# Patient Record
Sex: Female | Born: 1995 | Race: Black or African American | Hispanic: No | Marital: Single | Smoking: Never smoker
Health system: Southern US, Community
[De-identification: ages and names within clinical notes are randomized; demographics above are authoritative.]

## PROBLEM LIST (undated history)

## (undated) DIAGNOSIS — G43909 Migraine, unspecified, not intractable, without status migrainosus: Secondary | ICD-10-CM

## (undated) HISTORY — PX: MOUTH SURGERY: SHX715

---
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
## 2017-05-14 ENCOUNTER — Other Ambulatory Visit: Payer: Self-pay | Admitting: Orthopedic Surgery

## 2017-05-14 DIAGNOSIS — R229 Localized swelling, mass and lump, unspecified: Secondary | ICD-10-CM

## 2017-05-24 ENCOUNTER — Ambulatory Visit
Admission: RE | Admit: 2017-05-24 | Discharge: 2017-05-24 | Disposition: A | Discharge status: Home / Self Care | Payer: 59 | Source: Ambulatory Visit | Attending: Orthopedic Surgery | Admitting: Orthopedic Surgery

## 2017-05-24 DIAGNOSIS — R229 Localized swelling, mass and lump, unspecified: Secondary | ICD-10-CM

## 2017-06-21 ENCOUNTER — Other Ambulatory Visit: Payer: Self-pay | Admitting: Orthopedic Surgery

## 2017-06-21 DIAGNOSIS — IMO0002 Reserved for concepts with insufficient information to code with codable children: Secondary | ICD-10-CM

## 2017-06-21 DIAGNOSIS — R229 Localized swelling, mass and lump, unspecified: Principal | ICD-10-CM

## 2017-07-13 ENCOUNTER — Ambulatory Visit
Admission: RE | Admit: 2017-07-13 | Discharge: 2017-07-13 | Disposition: A | Discharge status: Home / Self Care | Payer: 59 | Source: Ambulatory Visit | Attending: Orthopedic Surgery | Admitting: Orthopedic Surgery

## 2017-07-13 DIAGNOSIS — R229 Localized swelling, mass and lump, unspecified: Principal | ICD-10-CM

## 2017-07-13 DIAGNOSIS — IMO0002 Reserved for concepts with insufficient information to code with codable children: Secondary | ICD-10-CM

## 2017-07-13 MED ORDER — IOPAMIDOL (ISOVUE-M 200) INJECTION 41%
1.5000 mL | Freq: Once | INTRAMUSCULAR | Status: AC
  Administered 2017-07-13: 1.5 mL via INTRA_ARTICULAR

## 2020-02-22 ENCOUNTER — Emergency Department (HOSPITAL_BASED_OUTPATIENT_CLINIC_OR_DEPARTMENT_OTHER)
Admission: EM | Admit: 2020-02-22 | Discharge: 2020-02-22 | Disposition: A | Discharge status: Home / Self Care | Payer: 59 | Attending: Emergency Medicine | Admitting: Emergency Medicine

## 2020-02-22 ENCOUNTER — Other Ambulatory Visit: Payer: Self-pay

## 2020-02-22 ENCOUNTER — Encounter (HOSPITAL_BASED_OUTPATIENT_CLINIC_OR_DEPARTMENT_OTHER): Payer: Self-pay | Admitting: Emergency Medicine

## 2020-02-22 ENCOUNTER — Ambulatory Visit (HOSPITAL_BASED_OUTPATIENT_CLINIC_OR_DEPARTMENT_OTHER): Payer: 59

## 2020-02-22 ENCOUNTER — Other Ambulatory Visit (HOSPITAL_BASED_OUTPATIENT_CLINIC_OR_DEPARTMENT_OTHER): Payer: Self-pay | Admitting: Family Medicine

## 2020-02-22 ENCOUNTER — Encounter (HOSPITAL_BASED_OUTPATIENT_CLINIC_OR_DEPARTMENT_OTHER): Payer: Self-pay

## 2020-02-22 DIAGNOSIS — R252 Cramp and spasm: Secondary | ICD-10-CM | POA: Insufficient documentation

## 2020-02-22 DIAGNOSIS — M79605 Pain in left leg: Secondary | ICD-10-CM

## 2020-02-22 MED ORDER — CYCLOBENZAPRINE HCL 10 MG PO TABS: 10.0000 mg | ORAL_TABLET | Freq: Two times a day (BID) | ORAL | 0 refills | Status: DC | PRN

## 2020-02-22 NOTE — Discharge Instructions (Addendum)
Recommend trial of NSAIDs such as naproxen or ibuprofen as well as the newly prescribed Flexeril for your pain. Note the flexeril can make you mildly drowsy and should not be taken while driving or operating heavy machinery. Call the radiology department to schedule your ultrasound.  Return to ER if you develop significantly worsening pain, swelling, or other new concerning symptom.

## 2020-02-22 NOTE — ED Triage Notes (Signed)
Cramping pain in left calf and shin x5 days.  Cramps come when she gets up from sitting position.  After "I get going", they stop.  It is only when she first gets up and starts walking.

## 2020-02-23 NOTE — ED Provider Notes (Signed)
[facility] EMERGENCY DEPARTMENT Provider Note   CSN: [id number] Arrival date & time: 02/22/20  1551     History Chief Complaint  Patient presents with  . Leg Pain    Sandra Shields is a 24 y.o. female.  Presents to ER with complaints of left leg cramping.  Symptoms ongoing for approximately 1 week, notes that gets worse with certain positions.  Primarily occurring in her left calf.  No pain or swelling on the right side.  No generalized swelling on left.  Is on oral contraceptives.  No other medical problems or medications.  No known history of DVT/PE.  HPI     History reviewed. No pertinent past medical history.  There are no problems to display for this patient.   Past Surgical History:  Procedure Laterality Date  . MOUTH SURGERY       OB History   No obstetric history on file.     No family history on file.  Social History   Tobacco Use  . Smoking status: Never Smoker  . Smokeless tobacco: Never Used  Substance Use Topics  . Alcohol use: No  . Drug use: No    Home Medications Prior to Admission medications   Medication Sig Start Date End Date Taking? Authorizing Provider  cyclobenzaprine (FLEXERIL) 10 MG tablet Take 1 tablet (10 mg total) by mouth 2 (two) times daily as needed for muscle spasms. 02/22/20   Lucrezia Starch, MD  norethindrone-ethinyl estradiol-iron (LARIN FE 1.5/30) 1.5-30 MG-MCG tablet Take 1 tablet by mouth daily.    [provider]    Allergies    Patient has no known allergies.  Review of Systems   Review of Systems  Constitutional: Negative for chills and fever.  HENT: Negative for ear pain and sore throat.   Eyes: Negative for pain and visual disturbance.  Respiratory: Negative for cough and shortness of breath.   Cardiovascular: Negative for chest pain and palpitations.  Gastrointestinal: Negative for abdominal pain and vomiting.  Genitourinary: Negative for dysuria and hematuria.  Musculoskeletal:  Positive for arthralgias. Negative for back pain.  Skin: Negative for color change and rash.  Neurological: Negative for seizures and syncope.  All other systems reviewed and are negative.   Physical Exam Updated Vital Signs BP 122/70 (BP Location: Right Arm)   Pulse 74   Temp 98.4 F (36.9 C)   Resp 14   Ht 5\' 6"  (1.676 m)   Wt 99.3 kg   LMP 02/13/2020   SpO2 100%   BMI 35.33 kg/m   Physical Exam Vitals and nursing note reviewed.  Constitutional:      General: She is not in acute distress.    Appearance: She is well-developed.  HENT:     Head: Normocephalic and atraumatic.  Eyes:     Conjunctiva/sclera: Conjunctivae normal.  Cardiovascular:     Rate and Rhythm: Normal rate and regular rhythm.     Heart sounds: No murmur.  Pulmonary:     Effort: Pulmonary effort is normal. No respiratory distress.     Breath sounds: Normal breath sounds.  Abdominal:     Palpations: Abdomen is soft.     Tenderness: There is no abdominal tenderness.  Musculoskeletal:     Cervical back: Neck supple.     Comments: Normal-appearing lower extremities bilaterally, no tenderness to palpation throughout lower extremities, compartments soft, distal DP and PT pulses intact bilaterally, normal joint ROM  Skin:    General: Skin is warm and dry.  Neurological:     General: No focal deficit present.     Mental Status: She is alert.     Comments: Sensation intact throughout lower extremities     ED Results / Procedures / Treatments   Labs (all labs ordered are listed, but only abnormal results are displayed) Labs Reviewed - No data to display  EKG None  Radiology No results found.  Procedures Procedures (including critical care time)  Medications Ordered in ED Medications - No data to display  ED Course  I have reviewed the triage vital signs and the nursing notes.  Pertinent labs & imaging results that were available during my care of the patient were reviewed by me and  considered in my medical decision making (see chart for details).    MDM Rules/Calculators/A&P                      24 year old girl presenting to ER with complaint of left leg cramping.  On physical exam no abnormalities were appreciated.  Recommended that patient obtain ultrasound to rule out DVT.  Patient reports that she was concerned about possible cost and wants to delay obtaining the study.  As alternative, ordered outpatient ultrasound.  Stressed importance of return for worsening symptoms and importance of obtaining this test. She demonstrated understanding of my concerns and desire to obtain test. Will give trial of muscle relaxer.     After the discussed management above, the patient was determined to be safe for discharge.  The patient was in agreement with this plan and all questions regarding their care were answered.  ED return precautions were discussed and the patient will return to the ED with any significant worsening of condition.   Final Clinical Impression(s) / ED Diagnoses Final diagnoses:  Leg cramping    Rx / DC Orders ED Discharge Orders         Ordered    US Venous Img Lower Unilateral Left     02/22/20 1637    cyclobenzaprine (FLEXERIL) 10 MG tablet  2 times daily PRN     02/22/20 1639           Milagros Loll, MD 02/23/20 2052

## 2020-11-23 DIAGNOSIS — N200 Calculus of kidney: Secondary | ICD-10-CM

## 2020-11-23 HISTORY — DX: Calculus of kidney: N20.0

## 2021-07-14 ENCOUNTER — Ambulatory Visit
Admission: EM | Admit: 2021-07-14 | Discharge: 2021-07-14 | Disposition: A | Discharge status: Home / Self Care | Payer: 59 | Attending: Emergency Medicine | Admitting: Emergency Medicine

## 2021-07-14 ENCOUNTER — Other Ambulatory Visit: Payer: Self-pay

## 2021-07-14 ENCOUNTER — Encounter: Payer: Self-pay | Admitting: Emergency Medicine

## 2021-07-14 DIAGNOSIS — R350 Frequency of micturition: Secondary | ICD-10-CM | POA: Insufficient documentation

## 2021-07-14 DIAGNOSIS — R3915 Urgency of urination: Secondary | ICD-10-CM | POA: Insufficient documentation

## 2021-07-14 DIAGNOSIS — R300 Dysuria: Secondary | ICD-10-CM | POA: Diagnosis not present

## 2021-07-14 LAB — POCT URINALYSIS DIP (MANUAL ENTRY)
Bilirubin, UA: NEGATIVE
Blood, UA: NEGATIVE
Glucose, UA: NEGATIVE mg/dL
Ketones, POC UA: NEGATIVE mg/dL
Leukocytes, UA: NEGATIVE
Nitrite, UA: NEGATIVE
Protein Ur, POC: NEGATIVE mg/dL
Spec Grav, UA: 1.025 (ref 1.010–1.025)
Urobilinogen, UA: 0.2 E.U./dL
pH, UA: 6 (ref 5.0–8.0)

## 2021-07-14 MED ORDER — CEPHALEXIN 500 MG PO CAPS: 1000.0000 mg | ORAL_CAPSULE | Freq: Two times a day (BID) | ORAL | 0 refills | Status: AC

## 2021-07-14 NOTE — Discharge Instructions (Addendum)
Your urinalysis did not show any signs of infection today, it has been sent to the lab to see if it grows bacteria  Due to your symptoms I am going to treat you prophylactically for a urinary tract infection, if your antibiotics need to be changed someone will call you  Take Keflex twice a day for the next 5 days  Your vaginal swab checking for bacterial vaginosis and yeast will take about 2 to 5 days to return, you will be called if positive for either in treatment will be sent into the pharmacy  At any point if your symptoms become worse please return to urgent care for reevaluation

## 2021-07-14 NOTE — ED Provider Notes (Signed)
[name]    CSN: [id number] Arrival date & time: 07/14/21  0905      History   Chief Complaint Chief Complaint  Patient presents with   Dysuria    HPI Sandra Shields is a 25 y.o. female.   Patient presents with dysuria, urinary urgency ,frequency, vaginal itching, lower abdominal pressure and left-sided flank For 5 days.  Unsure of discharge present.  Denies odor, hematuria, nausea, vomiting, diarrhea, fever, chills.  Using Tylenol with no relief.  History of BV.  Not sexually active.  History reviewed. No pertinent past medical history.  There are no problems to display for this patient.   Past Surgical History:  Procedure Laterality Date   MOUTH SURGERY      OB History   No obstetric history on file.      Home Medications    Prior to Admission medications   Medication Sig Start Date End Date Taking? Authorizing Provider  cephALEXin (KEFLEX) 500 MG capsule Take 2 capsules (1,000 mg total) by mouth 2 (two) times daily. 07/14/21  Yes Tashema Tiller R, NP  norethindrone-ethinyl estradiol-iron (LOESTRIN FE) 1.5-30 MG-MCG tablet Take 1 tablet by mouth daily.   Yes [provider]  cyclobenzaprine (FLEXERIL) 10 MG tablet Take 1 tablet (10 mg total) by mouth 2 (two) times daily as needed for muscle spasms. 02/22/20   Milagros Loll, MD    Family History No family history on file.  Social History Social History   Tobacco Use   Smoking status: Never   Smokeless tobacco: Never  Substance Use Topics   Alcohol use: No   Drug use: No     Allergies   Patient has no known allergies.   Review of Systems Review of Systems  Constitutional: Negative.   Respiratory: Negative.    Gastrointestinal:  Positive for abdominal pain. Negative for abdominal distention, anal bleeding, blood in stool, constipation, diarrhea, nausea, rectal pain and vomiting.  Genitourinary:  Positive for dysuria, flank pain, frequency, urgency and vaginal  discharge. Negative for decreased urine volume, difficulty urinating, dyspareunia, enuresis, genital sores, hematuria, menstrual problem, pelvic pain, vaginal bleeding and vaginal pain.  Skin: Negative.   Neurological: Negative.     Physical Exam Triage Vital Signs ED Triage Vitals  Enc Vitals Group     BP 07/14/21 0916 113/71     Pulse Rate 07/14/21 0916 72     Resp 07/14/21 0916 18     Temp 07/14/21 0916 98.6 F (37 C)     Temp Source 07/14/21 0916 Oral     SpO2 07/14/21 0916 98 %     Weight 07/14/21 0918 190 lb (86.2 kg)     Height 07/14/21 0918 5\' 6"  (1.676 m)     Head Circumference --      Peak Flow --      Pain Score 07/14/21 0918 6     Pain Loc --      Pain Edu? --      Excl. in GC? --    No data found.  Updated Vital Signs BP 113/71 (BP Location: Left Arm)   Pulse 72   Temp 98.6 F (37 C) (Oral)   Resp 18   Ht 5\' 6"  (1.676 m)   Wt 190 lb (86.2 kg)   LMP 06/30/2021   SpO2 98%   BMI 30.67 kg/m   Visual Acuity Right Eye Distance:   Left Eye Distance:   Bilateral Distance:    Right Eye Near:   Left  Eye Near:    Bilateral Near:     Physical Exam Constitutional:      Appearance: Normal appearance. She is normal weight.  Eyes:     Extraocular Movements: Extraocular movements intact.  Pulmonary:     Effort: Pulmonary effort is normal.  Abdominal:     General: Abdomen is flat. Bowel sounds are normal.     Palpations: Abdomen is soft.     Tenderness: There is abdominal tenderness in the suprapubic area. There is left CVA tenderness. There is no right CVA tenderness or guarding. Negative signs include Murphy's sign.  Musculoskeletal:     Cervical back: Normal range of motion.  Skin:    General: Skin is warm and dry.  Neurological:     Mental Status: She is alert and oriented to person, place, and time. Mental status is at baseline.  Psychiatric:        Mood and Affect: Mood normal.        Behavior: Behavior normal.     UC Treatments / Results   Labs (all labs ordered are listed, but only abnormal results are displayed) Labs Reviewed  URINE CULTURE  POCT URINALYSIS DIP (MANUAL ENTRY)  POCT URINALYSIS DIP (MANUAL ENTRY)  CERVICOVAGINAL ANCILLARY ONLY    EKG   Radiology No results found.  Procedures Procedures (including critical care time)  Medications Ordered in UC Medications - No data to display  Initial Impression / Assessment and Plan / UC Course  I have reviewed the triage vital signs and the nursing notes.  Pertinent labs & imaging results that were available during my care of the patient were reviewed by me and considered in my medical decision making (see chart for details).  Dysuria Urinary frequency Urinary urgency  1.  Urinalysis negative, sent for culture 2.  Will treat prophylactically for urinary tract infection due to symptomology, prescribed Keflex 1000 mg twice daily for 5 days 3.  STI screening for BV and yeast pending, will treat per protocol 4.  Given strict return precautions for persistent or worsening symptoms for reevaluation at urgent care Final Clinical Impressions(s) / UC Diagnoses   Final diagnoses:  Dysuria  Urinary frequency  Urinary urgency   Discharge Instructions   None    ED Prescriptions     Medication Sig Dispense Auth. Provider   cephALEXin (KEFLEX) 500 MG capsule Take 2 capsules (1,000 mg total) by mouth 2 (two) times daily. 20 capsule Valinda Hoar, NP      PDMP not reviewed this encounter.   Valinda Hoar, [place] 07/14/21 [phone]

## 2021-07-14 NOTE — ED Triage Notes (Signed)
Patient c/o dysuria, urgency and frequency for the past couple of days.  No hematuria.  Patient has taken Tylenol for the discomfort.

## 2021-07-15 LAB — URINE CULTURE: Culture: <10000 — AB

## 2021-07-15 LAB — CERVICOVAGINAL ANCILLARY ONLY
Bacterial Vaginitis (gardnerella): NEGATIVE
Candida Glabrata: NEGATIVE
Candida Vaginitis: NEGATIVE
Comment: NEGATIVE
Comment: NEGATIVE
Comment: NEGATIVE

## 2021-08-21 ENCOUNTER — Emergency Department (HOSPITAL_COMMUNITY)
Admission: EM | Admit: 2021-08-21 | Discharge: 2021-08-21 | Disposition: A | Discharge status: Home / Self Care | Payer: 59 | Attending: Emergency Medicine | Admitting: Emergency Medicine

## 2021-08-21 ENCOUNTER — Emergency Department (HOSPITAL_COMMUNITY): Payer: 59

## 2021-08-21 ENCOUNTER — Other Ambulatory Visit: Payer: Self-pay

## 2021-08-21 ENCOUNTER — Encounter (HOSPITAL_COMMUNITY): Payer: Self-pay

## 2021-08-21 DIAGNOSIS — R110 Nausea: Secondary | ICD-10-CM | POA: Diagnosis not present

## 2021-08-21 DIAGNOSIS — R1031 Right lower quadrant pain: Secondary | ICD-10-CM | POA: Diagnosis not present

## 2021-08-21 DIAGNOSIS — R1011 Right upper quadrant pain: Secondary | ICD-10-CM | POA: Diagnosis present

## 2021-08-21 DIAGNOSIS — N200 Calculus of kidney: Secondary | ICD-10-CM

## 2021-08-21 LAB — COMPREHENSIVE METABOLIC PANEL
ALT: 10 U/L (ref 0–44)
AST: 17 U/L (ref 15–41)
Albumin: 4 g/dL (ref 3.5–5.0)
Alkaline Phosphatase: 44 U/L (ref 38–126)
Anion gap: 9 (ref 5–15)
BUN: 15 mg/dL (ref 6–20)
CO2: 23 mmol/L (ref 22–32)
Calcium: 9.2 mg/dL (ref 8.9–10.3)
Chloride: 106 mmol/L (ref 98–111)
Creatinine, Ser: 1.07 mg/dL — ABNORMAL HIGH (ref 0.44–1.00)
GFR, Estimated: >60 mL/min (ref >60)
Glucose, Bld: 112 mg/dL — ABNORMAL HIGH (ref 70–99)
Potassium: 3.9 mmol/L (ref 3.5–5.1)
Sodium: 138 mmol/L (ref 135–145)
Total Bilirubin: 0.4 mg/dL (ref 0.3–1.2)
Total Protein: 8.3 g/dL — ABNORMAL HIGH (ref 6.5–8.1)

## 2021-08-21 LAB — URINALYSIS, ROUTINE W REFLEX MICROSCOPIC
Bilirubin Urine: NEGATIVE
Glucose, UA: NEGATIVE mg/dL
Hgb urine dipstick: NEGATIVE
Ketones, ur: NEGATIVE mg/dL
Leukocytes,Ua: NEGATIVE
Nitrite: NEGATIVE
Protein, ur: NEGATIVE mg/dL
Specific Gravity, Urine: 1.025 (ref 1.005–1.030)
pH: 6 (ref 5.0–8.0)

## 2021-08-21 LAB — CBC
HCT: 36.9 % (ref 36.0–46.0)
Hemoglobin: 12.2 g/dL (ref 12.0–15.0)
MCH: 29.4 pg (ref 26.0–34.0)
MCHC: 33.1 g/dL (ref 30.0–36.0)
MCV: 88.9 fL (ref 80.0–100.0)
Platelets: 377 10*3/uL (ref 150–400)
RBC: 4.15 MIL/uL (ref 3.87–5.11)
RDW: 12.6 % (ref 11.5–15.5)
WBC: 9.8 10*3/uL (ref 4.0–10.5)
nRBC: 0 % (ref 0.0–0.2)

## 2021-08-21 LAB — LIPASE, BLOOD: Lipase: 25 U/L (ref 11–51)

## 2021-08-21 LAB — I-STAT BETA HCG BLOOD, ED (MC, WL, AP ONLY): I-stat hCG, quantitative: <5 m[IU]/mL (ref <5)

## 2021-08-21 MED ORDER — HYDROMORPHONE HCL 1 MG/ML IJ SOLN
0.5000 mg | Freq: Once | INTRAMUSCULAR | Status: AC
  Administered 2021-08-21: 0.5 mg via INTRAVENOUS
  Filled 2021-08-21: qty 1

## 2021-08-21 MED ORDER — IOHEXOL 350 MG/ML SOLN
80.0000 mL | Freq: Once | INTRAVENOUS | Status: AC | PRN
  Administered 2021-08-21: 80 mL via INTRAVENOUS

## 2021-08-21 MED ORDER — MORPHINE SULFATE (PF) 4 MG/ML IV SOLN
6.0000 mg | Freq: Once | INTRAVENOUS | Status: AC
  Administered 2021-08-21: 6 mg via INTRAVENOUS
  Filled 2021-08-21: qty 2

## 2021-08-21 MED ORDER — KETOROLAC TROMETHAMINE 30 MG/ML IJ SOLN
30.0000 mg | Freq: Once | INTRAMUSCULAR | Status: AC
  Administered 2021-08-21: 30 mg via INTRAVENOUS
  Filled 2021-08-21: qty 1

## 2021-08-21 MED ORDER — ONDANSETRON HCL 4 MG/2ML IJ SOLN
4.0000 mg | Freq: Once | INTRAMUSCULAR | Status: AC
  Administered 2021-08-21: 4 mg via INTRAVENOUS
  Filled 2021-08-21: qty 2

## 2021-08-21 MED ORDER — OXYCODONE-ACETAMINOPHEN 5-325 MG PO TABS: 1.0000 | ORAL_TABLET | Freq: Four times a day (QID) | ORAL | 0 refills | Status: AC | PRN

## 2021-08-21 NOTE — ED Provider Notes (Signed)
[facility]-EMERGENCY DEPT Provider Note   CSN: [id number] Arrival date & time: 08/21/21  5400     History Chief Complaint  Patient presents with   Abdominal Pain   Nausea    Sandra Shields is a 25 y.o. female.  25 year old female presents with 24 hours of right upper and right lower quadrant abdominal pain.  Has had nausea but no vomiting.  No fever or chills.  Pain has been persistent and no prior history of same.  Denies any vaginal bleeding or discharge.  No dysuria or hematuria.  No treatment use prior to arrival.      History reviewed. No pertinent past medical history.  There are no problems to display for this patient.   Past Surgical History:  Procedure Laterality Date   MOUTH SURGERY       OB History   No obstetric history on file.     History reviewed. No pertinent family history.  Social History   Tobacco Use   Smoking status: Never   Smokeless tobacco: Never  Vaping Use   Vaping Use: Never used  Substance Use Topics   Alcohol use: No   Drug use: No    Home Medications Prior to Admission medications   Medication Sig Start Date End Date Taking? Authorizing Provider  cephALEXin (KEFLEX) 500 MG capsule Take 2 capsules (1,000 mg total) by mouth 2 (two) times daily. 07/14/21   Valinda Hoar, NP  cyclobenzaprine (FLEXERIL) 10 MG tablet Take 1 tablet (10 mg total) by mouth 2 (two) times daily as needed for muscle spasms. 02/22/20   Milagros Loll, MD  norethindrone-ethinyl estradiol-iron (LOESTRIN FE) 1.5-30 MG-MCG tablet Take 1 tablet by mouth daily.    [provider]    Allergies    Patient has no known allergies.  Review of Systems   Review of Systems  All other systems reviewed and are negative.  Physical Exam Updated Vital Signs BP 115/81 (BP Location: Left Arm)   Pulse 75   Temp 98 F (36.7 C) (Oral)   Resp 16   Ht 1.676 m (5\' 6" )   Wt 86.2 kg   LMP 07/24/2021 (Approximate)   SpO2 100%   BMI  30.67 kg/m   Physical Exam Vitals and nursing note reviewed.  Constitutional:      General: She is not in acute distress.    Appearance: Normal appearance. She is well-developed. She is not toxic-appearing.  HENT:     Head: Normocephalic and atraumatic.  Eyes:     General: Lids are normal.     Conjunctiva/sclera: Conjunctivae normal.     Pupils: Pupils are equal, round, and reactive to light.  Neck:     Thyroid: No thyroid mass.     Trachea: No tracheal deviation.  Cardiovascular:     Rate and Rhythm: Normal rate and regular rhythm.     Heart sounds: Normal heart sounds. No murmur heard.   No gallop.  Pulmonary:     Effort: Pulmonary effort is normal. No respiratory distress.     Breath sounds: Normal breath sounds. No stridor. No decreased breath sounds, wheezing, rhonchi or rales.  Abdominal:     General: There is no distension.     Palpations: Abdomen is soft.     Tenderness: There is abdominal tenderness in the right upper quadrant and right lower quadrant. There is no rebound.  Musculoskeletal:        General: No tenderness. Normal range of motion.  Cervical back: Normal range of motion and neck supple.  Skin:    General: Skin is warm and dry.     Findings: No abrasion or rash.  Neurological:     Mental Status: She is alert and oriented to person, place, and time. Mental status is at baseline.     GCS: GCS eye subscore is 4. GCS verbal subscore is 5. GCS motor subscore is 6.     Cranial Nerves: Cranial nerves are intact. No cranial nerve deficit.     Sensory: No sensory deficit.     Motor: Motor function is intact.  Psychiatric:        Attention and Perception: Attention normal.        Speech: Speech normal.        Behavior: Behavior normal.    ED Results / Procedures / Treatments   Labs (all labs ordered are listed, but only abnormal results are displayed) Labs Reviewed  LIPASE, BLOOD  COMPREHENSIVE METABOLIC PANEL  CBC  URINALYSIS, ROUTINE W REFLEX  MICROSCOPIC  I-STAT BETA HCG BLOOD, ED ([facility], [facility], [facility] ONLY)    EKG None  Radiology No results found.  Procedures Procedures   Medications Ordered in ED Medications  ondansetron (ZOFRAN) injection 4 mg (has no administration in time range)  morphine 4 MG/ML injection 6 mg (has no administration in time range)    ED Course  I have reviewed the triage vital signs and the nursing notes.  Pertinent labs & imaging results that were available during my care of the patient were reviewed by me and considered in my medical decision making (see chart for details).    MDM Rules/Calculators/A&P                           Patient's work-up consistent with kidney stone.  Medicated and now feels much better.  Will give urology referral Final Clinical Impression(s) / ED Diagnoses Final diagnoses:  None    Rx / DC Orders ED Discharge Orders     None        Lorre Nick, MD 08/21/21 1346

## 2021-08-21 NOTE — ED Triage Notes (Signed)
Patient reports that she had constipation x 4 days and took Metamucil and had a BM. Patient c/o right abdominal pain and diarrhea x 4 today.

## 2021-08-21 NOTE — ED Provider Notes (Signed)
Emergency Medicine Provider Triage Evaluation Note  Sandra Shields , a 25 y.o. female  was evaluated in triage.  Pt complains of abd pain.  Review of Systems  Positive: R side abd pain, R back pain, nausea, diarrhea Negative: Fever, cp, sob, dysuria, vaginal bleeding  Physical Exam  BP 115/81 (BP Location: Left Arm)   Pulse 75   Temp 98 F (36.7 C) (Oral)   Resp 16   Ht 5\' 6"  (1.676 m)   Wt 86.2 kg   LMP 07/24/2021 (Approximate)   SpO2 100%   BMI 30.67 kg/m  Gen:   Awake, no distress   Resp:  Normal effort  MSK:   Moves extremities without difficulty  Other:  Mild TTP R abdomen without focal point tenderness  Medical Decision Making  Medically screening exam initiated at 9:02 AM.  Appropriate orders placed.  Cambree Wery was informed that the remainder of the evaluation will be completed by another provider, this initial triage assessment does not replace that evaluation, and the importance of remaining in the ED until their evaluation is complete.  Patient report feeling constipated for the past few days, had a decent bowel movement 2 days ago, felt better but now having increasing pain to her right abdomen radiates towards her back.  Endorses nausea without vomiting.  No significant abdominal tenderness on initial exam.   Natale Milch, PA-C 08/21/21 08/23/21    1275, MD 08/21/21 [phone]

## 2022-11-26 ENCOUNTER — Ambulatory Visit
Admission: EM | Admit: 2022-11-26 | Discharge: 2022-11-26 | Disposition: A | Discharge status: Home / Self Care | Payer: BLUE CROSS/BLUE SHIELD | Attending: Internal Medicine | Admitting: Internal Medicine

## 2022-11-26 DIAGNOSIS — R300 Dysuria: Secondary | ICD-10-CM | POA: Insufficient documentation

## 2022-11-26 DIAGNOSIS — Z3202 Encounter for pregnancy test, result negative: Secondary | ICD-10-CM | POA: Diagnosis not present

## 2022-11-26 DIAGNOSIS — R350 Frequency of micturition: Secondary | ICD-10-CM | POA: Diagnosis not present

## 2022-11-26 DIAGNOSIS — R109 Unspecified abdominal pain: Secondary | ICD-10-CM | POA: Diagnosis not present

## 2022-11-26 LAB — POCT URINALYSIS DIP (MANUAL ENTRY)
Bilirubin, UA: NEGATIVE
Blood, UA: NEGATIVE
Glucose, UA: NEGATIVE mg/dL
Ketones, POC UA: NEGATIVE mg/dL
Leukocytes, UA: NEGATIVE
Nitrite, UA: NEGATIVE
Spec Grav, UA: 1.02 (ref 1.010–1.025)
Urobilinogen, UA: 0.2 E.U./dL
pH, UA: 7 (ref 5.0–8.0)

## 2022-11-26 LAB — POCT URINE PREGNANCY: Preg Test, Ur: NEGATIVE

## 2022-11-26 MED ORDER — TAMSULOSIN HCL 0.4 MG PO CAPS: 0.4000 mg | ORAL_CAPSULE | Freq: Every day | ORAL | 0 refills | Status: AC

## 2022-11-26 NOTE — ED Triage Notes (Signed)
Pt c/o dysuria, pelvic pressure, back pain onset ~ yesterday concerned for uti.

## 2022-11-26 NOTE — Discharge Instructions (Addendum)
I am suspicious that you could have a kidney stone given your history.  I have prescribed a medication that you may take to help it move along.  Your urine test did not show UTI.  Vaginal swab is pending.  Will call if it is abnormal.  Please go to the ER if symptoms persist or worsen.

## 2022-11-26 NOTE — ED Provider Notes (Signed)
[name]    CSN: [id number] Arrival date & time: 11/26/22  1054      History   Chief Complaint Chief Complaint  Patient presents with   Dysuria    HPI Sandra Shields is a 27 y.o. female.   Patient presents with dysuria, lower abdominal pain, right lower back pain that started yesterday.  Patient denies hematuria, vaginal discharge, fever, urinary frequency.  Denies any exposure to STD.  Patient reports that she is not sexually active.  Last menstrual cycle was 1 week ago.  She reports that she takes birth control pills.  Patient reports history of kidney stones as well with similar symptoms.   Dysuria   History reviewed. No pertinent past medical history.  There are no problems to display for this patient.   Past Surgical History:  Procedure Laterality Date   MOUTH SURGERY      OB History   No obstetric history on file.      Home Medications    Prior to Admission medications   Medication Sig Start Date End Date Taking? Authorizing Provider  tamsulosin (FLOMAX) 0.4 MG CAPS capsule Take 1 capsule (0.4 mg total) by mouth daily. 11/26/22  Yes Chemika Nightengale, Hildred Alamin E, FNP  cephALEXin (KEFLEX) 500 MG capsule Take 2 capsules (1,000 mg total) by mouth 2 (two) times daily. Patient not taking: Reported on 08/21/2021 07/14/21   Hans Eden, NP  loratadine (CLARITIN) 10 MG tablet Take 10 mg by mouth daily as needed for allergies.    [provider]  norethindrone-ethinyl estradiol-FE (LOESTRIN FE) 1-20 MG-MCG tablet Take 1 tablet by mouth daily. 07/29/21   [provider]  oxyCODONE-acetaminophen (PERCOCET/ROXICET) 5-325 MG tablet Take 1 tablet by mouth every 6 (six) hours as needed for severe pain. 08/21/21   Lacretia Leigh, MD    Family History History reviewed. No pertinent family history.  Social History Social History   Tobacco Use   Smoking status: Never   Smokeless tobacco: Never  Vaping Use   Vaping Use: Never used  Substance Use  Topics   Alcohol use: No   Drug use: No     Allergies   Patient has no known allergies.   Review of Systems Review of Systems Per HPI  Physical Exam Triage Vital Signs ED Triage Vitals  Enc Vitals Group     BP 11/26/22 1109 106/75     Pulse Rate 11/26/22 1109 75     Resp 11/26/22 1109 16     Temp 11/26/22 1109 98 F (36.7 C)     Temp Source 11/26/22 1109 Oral     SpO2 11/26/22 1109 99 %     Weight --      Height --      Head Circumference --      Peak Flow --      Pain Score 11/26/22 1108 5     Pain Loc --      Pain Edu? --      Excl. in [place]? --    No data found.  Updated Vital Signs BP 106/75 (BP Location: Left Arm)   Pulse 75   Temp 98 F (36.7 C) (Oral)   Resp 16   SpO2 99%   Visual Acuity Right Eye Distance:   Left Eye Distance:   Bilateral Distance:    Right Eye Near:   Left Eye Near:    Bilateral Near:     Physical Exam Constitutional:      General: She is not  in acute distress.    Appearance: Normal appearance. She is not toxic-appearing or diaphoretic.  HENT:     Head: Normocephalic and atraumatic.  Eyes:     Extraocular Movements: Extraocular movements intact.     Conjunctiva/sclera: Conjunctivae normal.  Cardiovascular:     Rate and Rhythm: Normal rate and regular rhythm.     Pulses: Normal pulses.     Heart sounds: Normal heart sounds.  Pulmonary:     Effort: Pulmonary effort is normal. No respiratory distress.     Breath sounds: Normal breath sounds.  Abdominal:     General: Bowel sounds are normal. There is no distension.     Palpations: Abdomen is soft.     Tenderness: There is no abdominal tenderness.  Musculoskeletal:     Comments: No tenderness to palpation to the lower back.  Neurological:     General: No focal deficit present.     Mental Status: She is alert and oriented to person, place, and time. Mental status is at baseline.  Psychiatric:        Mood and Affect: Mood normal.        Behavior: Behavior normal.         Thought Content: Thought content normal.        Judgment: Judgment normal.      UC Treatments / Results  Labs (all labs ordered are listed, but only abnormal results are displayed) Labs Reviewed  POCT URINALYSIS DIP (MANUAL ENTRY) - Abnormal; Notable for the following components:      Result Value   Protein Ur, POC trace (*)    All other components within normal limits  POCT URINE PREGNANCY  CERVICOVAGINAL ANCILLARY ONLY    EKG   Radiology No results found.  Procedures Procedures (including critical care time)  Medications Ordered in UC Medications - No data to display  Initial Impression / Assessment and Plan / UC Course  I have reviewed the triage vital signs and the nursing notes.  Pertinent labs & imaging results that were available during my care of the patient were reviewed by me and considered in my medical decision making (see chart for details).     UA is unremarkable.  Low concern for UTI at this time.  Urine pregnancy negative.  Cervicovaginal swab pending.  I am highly suspicious of kidney stone given patient has similar symptoms in the past with negative UA and CT scan that showed kidney stone.  Advised patient it may be best to go to the hospital for CT imaging as I do not have this here.  She declined going to the hospital.  Risks associated with not going to the hospital were discussed with patient.  Patient voiced understanding.  Offered patient KUB to see if we can see kidney stone here but she declined this as well.  Will prescribe tamsulosin to help alleviate kidney stone expulsion if it is present.  Patient was advised to go straight to the ER if symptoms persist or worsen.  She denies that she is currently in any pain so will defer pain management at this time.  Patient verbalized understanding and was agreeable with plan. Final Clinical Impressions(s) / UC Diagnoses   Final diagnoses:  Dysuria  Flank pain  Urinary frequency     Discharge  Instructions      I am suspicious that you could have a kidney stone given your history.  I have prescribed a medication that you may take to help it move along.  Your urine test did not show UTI.  Vaginal swab is pending.  Will call if it is abnormal.  Please go to the ER if symptoms persist or worsen.    ED Prescriptions     Medication Sig Dispense Auth. Provider   tamsulosin (FLOMAX) 0.4 MG CAPS capsule Take 1 capsule (0.4 mg total) by mouth daily. 30 capsule [place], Michele Rockers, [place]      PDMP not reviewed this encounter.   Teodora Medici, [place] 11/26/22 1229

## 2022-11-27 LAB — CERVICOVAGINAL ANCILLARY ONLY
Bacterial Vaginitis (gardnerella): NEGATIVE
Candida Glabrata: NEGATIVE
Candida Vaginitis: NEGATIVE
Comment: NEGATIVE
Comment: NEGATIVE
Comment: NEGATIVE

## 2022-12-09 IMAGING — CT CT ABD-PELV W/ CM
2 of 4 series · 15 of 46 positions shown, 17 images · IV contrast (omnipaque)
Comparison: No priors.

CLINICAL DATA: 25-year-old female with history of constipation for
the past 4 days. Right-sided abdominal pain and diarrhea 4 times
today.

EXAM:
CT ABDOMEN AND PELVIS WITH CONTRAST
TECHNIQUE: Multidetector CT imaging of the abdomen and pelvis was performed
using the standard protocol following bolus administration of
intravenous contrast.
CONTRAST:  80mL OMNIPAQUE IOHEXOL 350 MG/ML SOLN

[Series 2: axial st · axial · 0.68mm/px · z∈[+1065,+1490]mm · 12 of 95 slices shown, 14 images]
[im 5/95  soft-tissue]
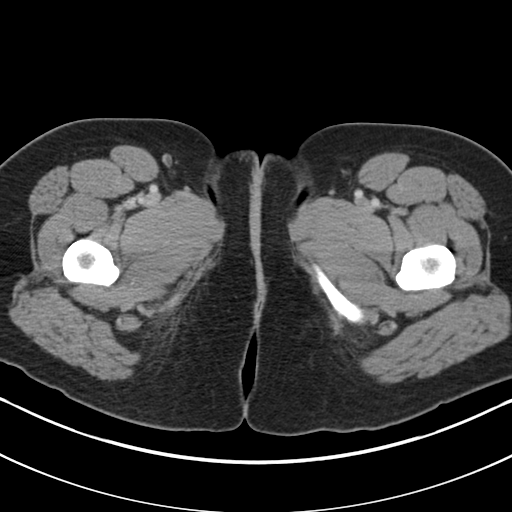
[im 5/95  bone]
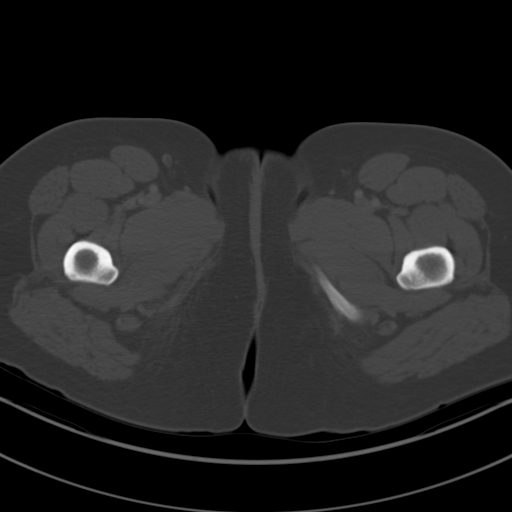
[im 15/95  soft-tissue]
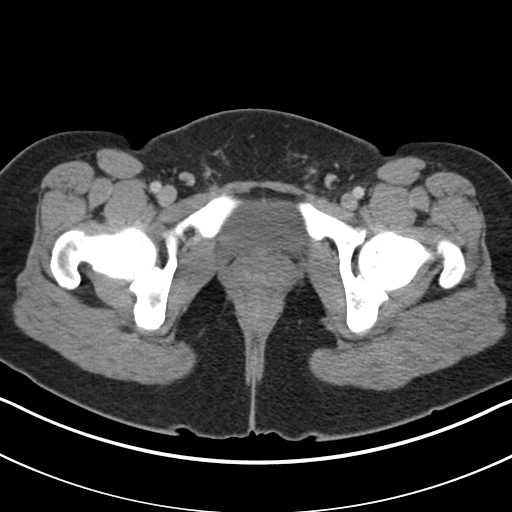
[im 20/95  soft-tissue]
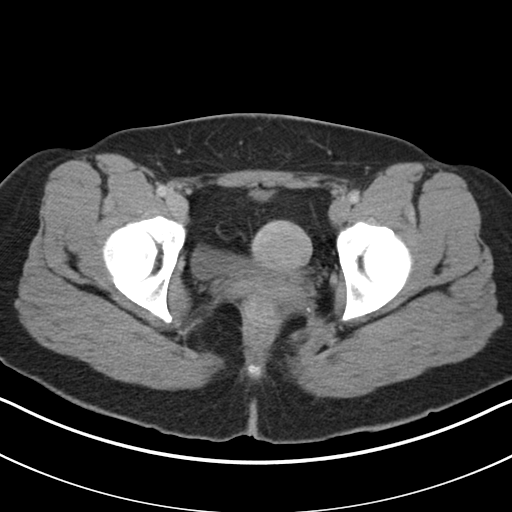
[im 30/95  soft-tissue]
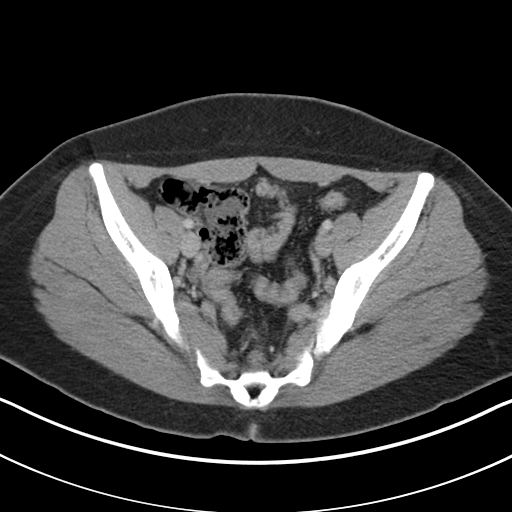
[im 35/95  soft-tissue]
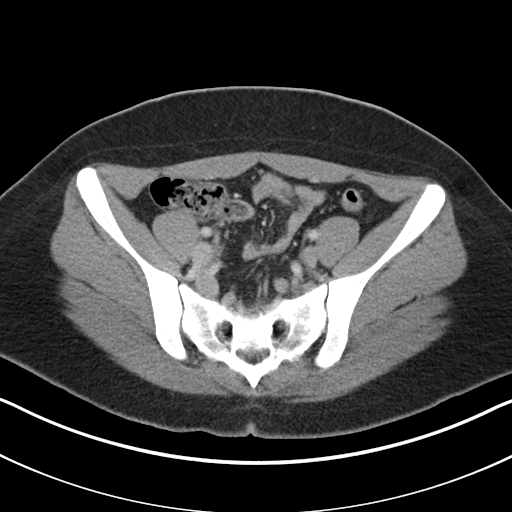
[im 45/95  soft-tissue]
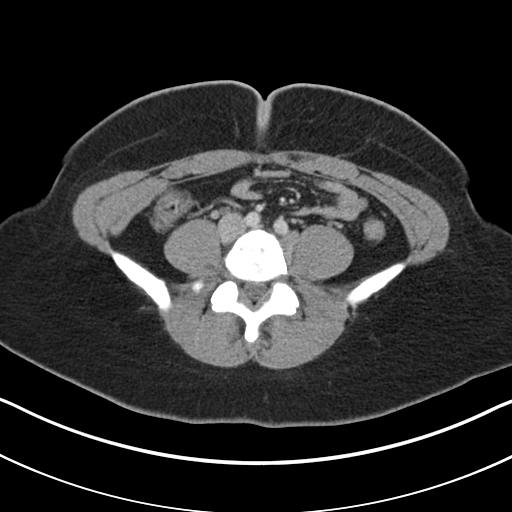
[im 50/95  soft-tissue]
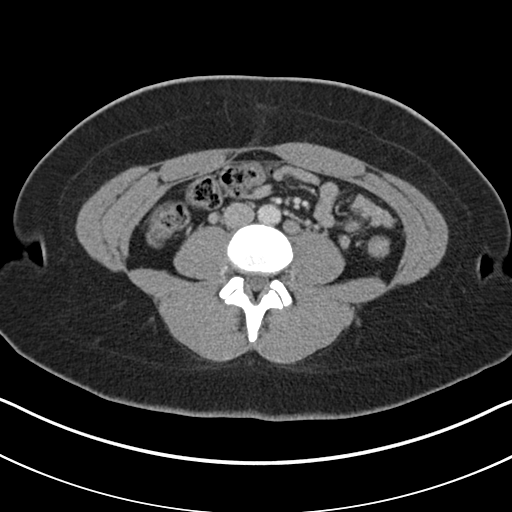
[im 60/95  soft-tissue]
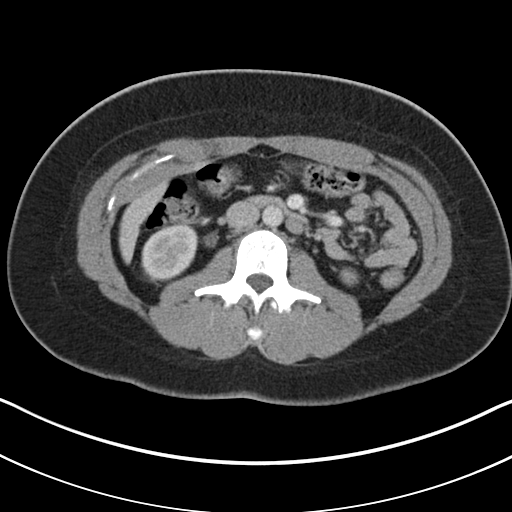
[im 65/95  soft-tissue]
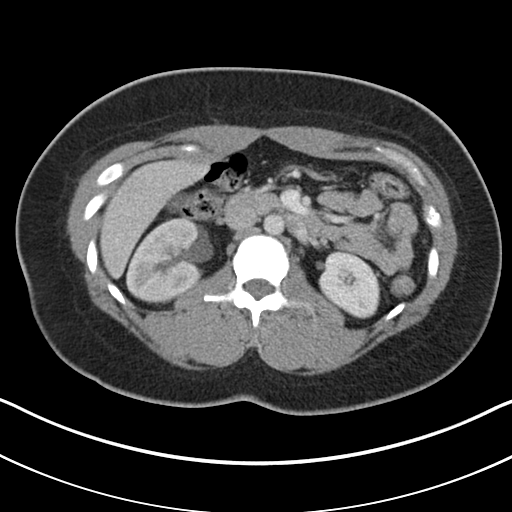
[im 65/95  bone]
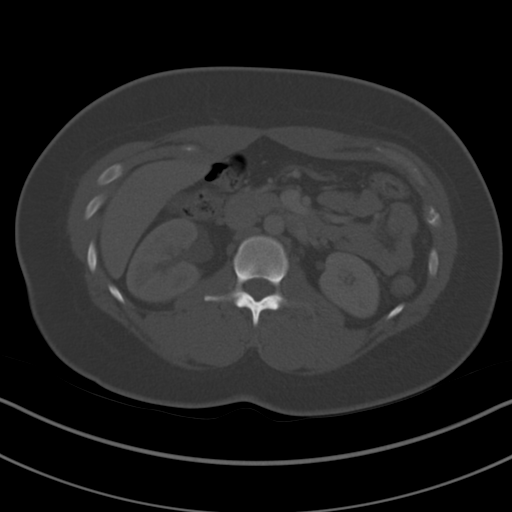
[im 75/95  soft-tissue]
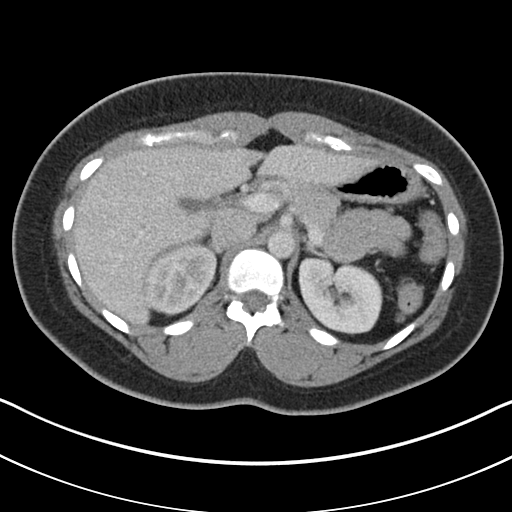
[im 80/95  soft-tissue]
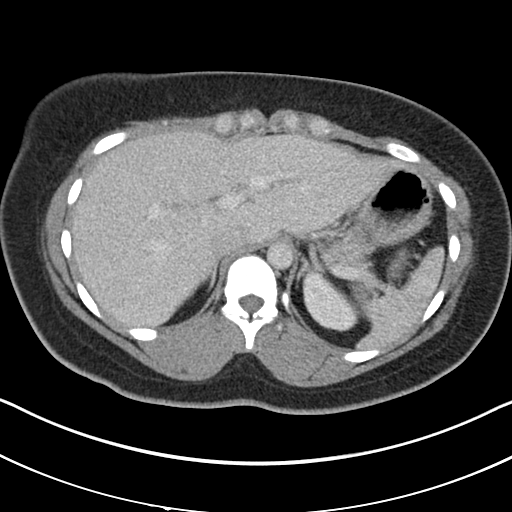
[im 90/95  soft-tissue]
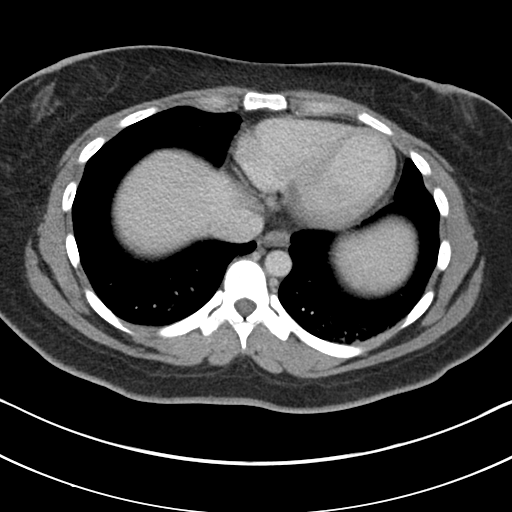

[Series 5: coronal st · coronal · 0.69mm/px · 3 of 134 slices shown]
[im 45/134  soft-tissue]
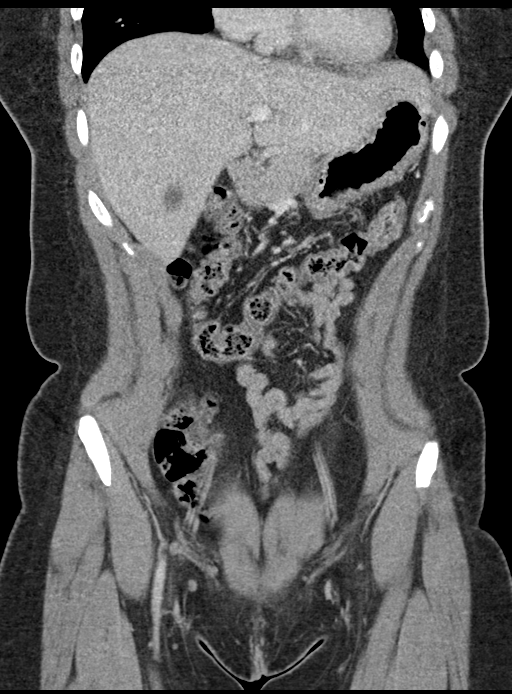
[im 60/134  soft-tissue]
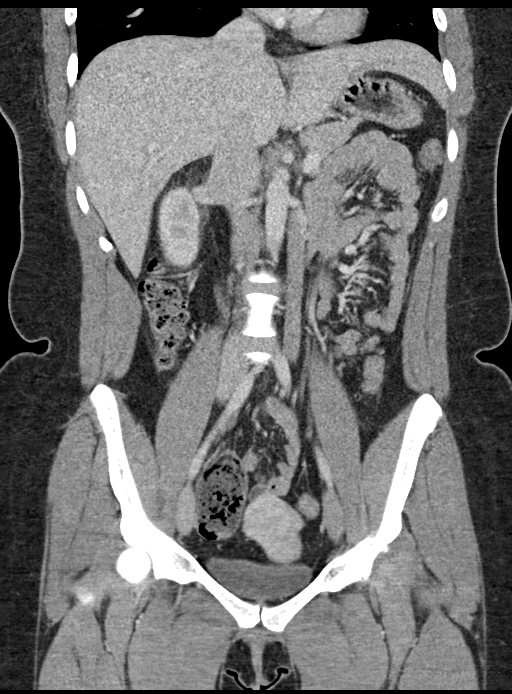
[im 74/134  soft-tissue]
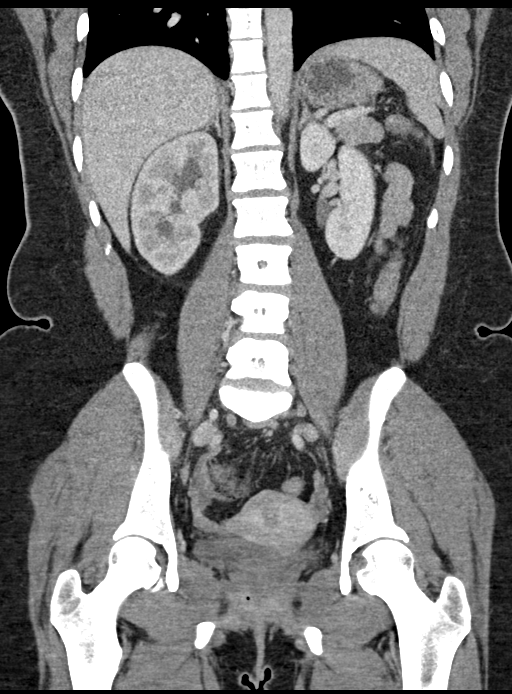

[15 of 46 positions shown; findings below may reference images not displayed]

FINDINGS: Lower chest: Unremarkable.

Hepatobiliary: No suspicious cystic or solid hepatic lesions. No
intra or extrahepatic biliary ductal dilatation. Gallbladder is
normal in appearance.

Pancreas: No pancreatic mass. No pancreatic ductal dilatation. No
pancreatic or peripancreatic fluid collections or inflammatory
changes.

Spleen: Unremarkable.

Adrenals/Urinary Tract: At the right ureterovesicular junction
(coronal image 77 of series 5) there is a 2 mm calculus which is
associated with mild proximal right hydroureteronephrosis, as well
as a delayed right nephrogram. Left kidney and bilateral adrenal
glands are normal in appearance. No left-sided
hydroureteronephrosis. Urinary bladder is otherwise normal in
appearance.

Stomach/Bowel: The appearance of the stomach is normal. No
pathologic dilatation of small bowel or colon. Stool burden does not
appear excessive. Normal appendix.

Vascular/Lymphatic: No significant atherosclerotic disease, aneurysm
or dissection noted in the abdominal or pelvic vasculature. No
lymphadenopathy noted in the abdomen or pelvis.

Reproductive: Uterus and ovaries are unremarkable in appearance.

Other: No significant volume of ascites.  No pneumoperitoneum.

Musculoskeletal: There are no aggressive appearing lytic or blastic
lesions noted in the visualized portions of the skeleton.
IMPRESSION: 1. 2 mm calculus at the right ureterovesicular junction with mild
proximal right hydroureteronephrosis and delayed right nephrogram
indicating obstruction.
2. No other acute findings are noted elsewhere in the abdomen or
pelvis. Specifically, no findings to suggest constipation.
3. Normal appendix.

## 2024-09-23 ENCOUNTER — Ambulatory Visit
Admission: RE | Admit: 2024-09-23 | Discharge: 2024-09-23 | Disposition: A | Discharge status: Home / Self Care | Payer: Self-pay | Attending: Physician Assistant | Admitting: Physician Assistant

## 2024-09-23 ENCOUNTER — Other Ambulatory Visit: Payer: Self-pay

## 2024-09-23 VITALS — BP 104/71 | HR 83 | Temp 98.1°F | Resp 18 | Ht 66.0 in | Wt 200.0 lb

## 2024-09-23 DIAGNOSIS — B379 Candidiasis, unspecified: Secondary | ICD-10-CM | POA: Insufficient documentation

## 2024-09-23 DIAGNOSIS — N390 Urinary tract infection, site not specified: Secondary | ICD-10-CM | POA: Insufficient documentation

## 2024-09-23 DIAGNOSIS — T3695XA Adverse effect of unspecified systemic antibiotic, initial encounter: Secondary | ICD-10-CM | POA: Diagnosis present

## 2024-09-23 DIAGNOSIS — R300 Dysuria: Secondary | ICD-10-CM | POA: Diagnosis present

## 2024-09-23 LAB — POCT URINE DIPSTICK
Bilirubin, UA: NEGATIVE
Blood, UA: NEGATIVE
Glucose, UA: NEGATIVE mg/dL
Ketones, POC UA: NEGATIVE mg/dL
Nitrite, UA: NEGATIVE
POC PROTEIN,UA: 30 — AB
Spec Grav, UA: >=1.03 — AB (ref 1.010–1.025)
Urobilinogen, UA: 1 U/dL
pH, UA: 6 (ref 5.0–8.0)

## 2024-09-23 LAB — POCT URINE PREGNANCY: Preg Test, Ur: NEGATIVE

## 2024-09-23 MED ORDER — NITROFURANTOIN MONOHYD MACRO 100 MG PO CAPS: 100.0000 mg | ORAL_CAPSULE | Freq: Two times a day (BID) | ORAL | 0 refills | Status: AC

## 2024-09-23 MED ORDER — FLUCONAZOLE 150 MG PO TABS: 150.0000 mg | ORAL_TABLET | ORAL | 0 refills | Status: AC | PRN

## 2024-09-23 NOTE — Discharge Instructions (Addendum)
 Based on your symptoms and results of the urinalysis I believe you have a UTI I recommend the following:  I have sent in a script for Macrobid 100 mg to be taken by mouth twice per day for 5 days Please finish the entire course of the antibiotic even if you are feeling better before it is completed unless you develop an allergic reaction or are told by a medical provider to stop taking it. We have sent a sample of your urine off for a urine culture.  This will help us  determine what bacteria is causing your symptoms as well as the most appropriate antibiotic to treat it.  If we need to make any adjustments to your medication regimen and new medication will be sent to the pharmacy on file and you will be updated via phone call in MyChart. Stay well hydrated (at least 75 oz of water per day) and avoid holding your urine for prolonged periods of time. If you have any of the following please return to urgent care or go to the emergency room: Persistent symptoms, fever, trouble urinating or inability to urinate, confusion, flank pain.

## 2024-09-23 NOTE — ED Provider Notes (Addendum)
 [facility]    CSN: [id number] Arrival date & time: 09/23/24  0807      History   Chief Complaint Chief Complaint  Patient presents with   Urinary Frequency    Pelvic pain, back pain, and a positive at home UTI test. - Entered by patient   Pelvic Pain    HPI Sandra Shields is a 28 y.o. female.  has no past medical history on file.   HPI  Pt presents today with concerns for UTI She states she has been having symptoms comprised of suprapubic pain, back pain, increased urinary frequency, bladder pressure since Tuesday. She took an at home UTI test on Wed and it was positive.  Interventions: AZO, Ibuprofen , cranberry pills, Nutriuric tea?     History reviewed. No pertinent past medical history.  There are no active problems to display for this patient.   Past Surgical History:  Procedure Laterality Date   MOUTH SURGERY      OB History   No obstetric history on file.      Home Medications    Prior to Admission medications   Medication Sig Start Date End Date Taking? Authorizing Provider  busPIRone (BUSPAR) 5 MG tablet Take 5 mg by mouth. 08/23/24 10/22/24 Yes [provider]  fluconazole  (DIFLUCAN ) 150 MG tablet Take 1 tablet (150 mg total) by mouth every three (3) days as needed. May repeat in 3 days if symptoms not resolved 09/23/24  Yes Nahome Bublitz E, PA-C  medroxyPROGESTERone Acetate 150 MG/ML SUSY Inject 150 mg into the muscle. 08/23/24  Yes [provider]  nitrofurantoin, macrocrystal-monohydrate, (MACROBID) 100 MG capsule Take 1 capsule (100 mg total) by mouth 2 (two) times daily. 09/23/24  Yes Broedy Osbourne E, PA-C  sertraline (ZOLOFT) 50 MG tablet Take 50 mg by mouth daily. 08/23/24 10/22/24 Yes [provider]  cephALEXin  (KEFLEX ) 500 MG capsule Take 2 capsules (1,000 mg total) by mouth 2 (two) times daily. Patient not taking: Reported on 08/21/2021 07/14/21   White, Adrienne R, NP  loratadine (CLARITIN) 10 MG tablet Take  10 mg by mouth daily as needed for allergies.    [provider]  norethindrone-ethinyl estradiol-FE (LOESTRIN FE) 1-20 MG-MCG tablet Take 1 tablet by mouth daily. 07/29/21   [provider]  oxyCODONE -acetaminophen  (PERCOCET/ROXICET) 5-325 MG tablet Take 1 tablet by mouth every 6 (six) hours as needed for severe pain. 08/21/21   Dasie Faden, MD  tamsulosin  (FLOMAX ) 0.4 MG CAPS capsule Take 1 capsule (0.4 mg total) by mouth daily. 11/26/22   Hazen Darryle BRAVO, FNP    Family History History reviewed. No pertinent family history.  Social History Social History   Tobacco Use   Smoking status: Never   Smokeless tobacco: Never  Vaping Use   Vaping status: Never Used  Substance Use Topics   Alcohol use: No   Drug use: No     Allergies   Patient has no known allergies.   Review of Systems Review of Systems  Constitutional:  Positive for chills. Negative for fever.  Gastrointestinal:  Positive for abdominal pain. Negative for diarrhea, nausea and vomiting.  Genitourinary:  Positive for dysuria, flank pain and frequency. Negative for difficulty urinating, vaginal bleeding, vaginal discharge and vaginal pain.     Physical Exam Triage Vital Signs ED Triage Vitals  Encounter Vitals Group     BP 09/23/24 0827 104/71     Girls Systolic BP Percentile --      Girls Diastolic BP Percentile --  Boys Systolic BP Percentile --      Boys Diastolic BP Percentile --      Pulse Rate 09/23/24 0827 83     Resp 09/23/24 0827 18     Temp 09/23/24 0827 98.1 F (36.7 C)     Temp Source 09/23/24 0827 Oral     SpO2 09/23/24 0827 97 %     Weight 09/23/24 0829 200 lb (90.7 kg)     Height 09/23/24 0829 5' 6 (1.676 m)     Head Circumference --      Peak Flow --      Pain Score 09/23/24 0829 7     Pain Loc --      Pain Education --      Exclude from Growth Chart --    No data found.  Updated Vital Signs BP 104/71 (BP Location: Right Arm)   Pulse 83   Temp 98.1 F (36.7  C) (Oral)   Resp 18   Ht 5' 6 (1.676 m)   Wt 200 lb (90.7 kg)   LMP 09/18/2024 (Approximate)   SpO2 97%   BMI 32.28 kg/m   Visual Acuity Right Eye Distance:   Left Eye Distance:   Bilateral Distance:    Right Eye Near:   Left Eye Near:    Bilateral Near:     Physical Exam Vitals reviewed.  Constitutional:      General: She is awake.     Appearance: Normal appearance. She is well-developed and well-groomed.  HENT:     Head: Normocephalic and atraumatic.  Eyes:     General: Lids are normal. Gaze aligned appropriately.     Extraocular Movements: Extraocular movements intact.     Conjunctiva/sclera: Conjunctivae normal.  Pulmonary:     Effort: Pulmonary effort is normal.  Neurological:     Mental Status: She is alert and oriented to person, place, and time.  Psychiatric:        Attention and Perception: Attention and perception normal.        Mood and Affect: Mood and affect normal.        Speech: Speech normal.        Behavior: Behavior normal. Behavior is cooperative.      UC Treatments / Results  Labs (all labs ordered are listed, but only abnormal results are displayed) Labs Reviewed  POCT URINE DIPSTICK - Abnormal; Notable for the following components:      Result Value   Color, UA straw (*)    Clarity, UA hazy (*)    Spec Grav, UA >=1.030 (*)    POC PROTEIN,UA =30 (*)    Leukocytes, UA Trace (*)    All other components within normal limits  URINE CULTURE  POCT URINE PREGNANCY    EKG   Radiology No results found.  Procedures Procedures (including critical care time)  Medications Ordered in UC Medications - No data to display  Initial Impression / Assessment and Plan / UC Course  I have reviewed the triage vital signs and the nursing notes.  Pertinent labs & imaging results that were available during my care of the patient were reviewed by me and considered in my medical decision making (see chart for details).      Final Clinical  Impressions(s) / UC Diagnoses   Final diagnoses:  Dysuria  Urinary tract infection without hematuria, site unspecified     Patient reports symptoms comprised of the following: dysuria, pressure, increased urinary frequency,  since Tuesday Results of UA  are consistent with UTI - urine sample sent for culture to determine causative organism and susceptibility- results to dictate further management  Recommend starting macrobid 100 mg PO BID x 5 days  Will provide script - discussed importance of finishing entire course of abx and staying well hydrated while recovering from UTI. Will also sent in script for diflucan  for potential Abx induced yeast infection. Pt is not sure if she gets these but her family member has told her she has when she was younger Reviewed ED precautions with patient Follow up as needed for persistent or worsening symptoms    Discharge Instructions      Based on your symptoms and results of the urinalysis I believe you have a UTI I recommend the following:  I have sent in a script for Macrobid 100 mg to be taken by mouth twice per day for 5 days.  Please finish the entire course of the antibiotic even if you are feeling better before it is completed unless you develop an allergic reaction or are told by a medical provider to stop taking it. We have sent a sample of your urine off for a urine culture.  This will help us  determine what bacteria is causing your symptoms as well as the most appropriate antibiotic to treat it.  If we need to make any adjustments to your medication regimen and new medication will be sent to the pharmacy on file and you will be updated via phone call in MyChart. Stay well hydrated (at least 75 oz of water per day) and avoid holding your urine for prolonged periods of time. If you have any of the following please return to urgent care or go to the emergency room: Persistent symptoms, fever, trouble urinating or inability to urinate, confusion,  flank pain.       ED Prescriptions     Medication Sig Dispense Auth. Provider   nitrofurantoin, macrocrystal-monohydrate, (MACROBID) 100 MG capsule Take 1 capsule (100 mg total) by mouth 2 (two) times daily. 10 capsule Tacey Dimaggio E, PA-C   fluconazole  (DIFLUCAN ) 150 MG tablet Take 1 tablet (150 mg total) by mouth every three (3) days as needed. May repeat in 3 days if symptoms not resolved 2 tablet Lakaya Tolen E, PA-C      PDMP not reviewed this encounter.   Marylene Rocky BRAVO, PA-C 09/23/24 9143    Marylene Rocky BRAVO, PA-C 09/23/24 9142

## 2024-09-23 NOTE — ED Triage Notes (Signed)
 Pt presents with a chief complaint of pelvic pain x 5 days. This is accompanied by urinary frequency and posterior lower back pain. Currently rates overall pain a 7/10. States the pain in pelvic region makes it almost unbearable to stand up straight. At-home UTI test was positive on Wednesday 10/29. AZO tablet taken yesterday. Also took cranberry supplements + Ibuprofen , has made symptoms worse.

## 2024-09-24 LAB — URINE CULTURE: Culture: <10000 — AB

## 2024-09-25 ENCOUNTER — Emergency Department (HOSPITAL_BASED_OUTPATIENT_CLINIC_OR_DEPARTMENT_OTHER)

## 2024-09-25 ENCOUNTER — Emergency Department (HOSPITAL_BASED_OUTPATIENT_CLINIC_OR_DEPARTMENT_OTHER)
Admission: EM | Admit: 2024-09-25 | Discharge: 2024-09-25 | Disposition: A | Discharge status: Home / Self Care | Attending: Emergency Medicine | Admitting: Emergency Medicine

## 2024-09-25 ENCOUNTER — Encounter (HOSPITAL_BASED_OUTPATIENT_CLINIC_OR_DEPARTMENT_OTHER): Payer: Self-pay | Admitting: Emergency Medicine

## 2024-09-25 ENCOUNTER — Other Ambulatory Visit: Payer: Self-pay

## 2024-09-25 ENCOUNTER — Ambulatory Visit (HOSPITAL_COMMUNITY): Payer: Self-pay

## 2024-09-25 DIAGNOSIS — D259 Leiomyoma of uterus, unspecified: Secondary | ICD-10-CM | POA: Insufficient documentation

## 2024-09-25 DIAGNOSIS — R10A2 Flank pain, left side: Secondary | ICD-10-CM | POA: Diagnosis present

## 2024-09-25 DIAGNOSIS — N3000 Acute cystitis without hematuria: Secondary | ICD-10-CM | POA: Insufficient documentation

## 2024-09-25 HISTORY — DX: Migraine, unspecified, not intractable, without status migrainosus: G43.909

## 2024-09-25 LAB — COMPREHENSIVE METABOLIC PANEL WITH GFR
ALT: 7 U/L (ref 0–44)
AST: 25 U/L (ref 15–41)
Albumin: 4.2 g/dL (ref 3.5–5.0)
Alkaline Phosphatase: 83 U/L (ref 38–126)
Anion gap: 14 (ref 5–15)
BUN: 9 mg/dL (ref 6–20)
CO2: 23 mmol/L (ref 22–32)
Calcium: 9.7 mg/dL (ref 8.9–10.3)
Chloride: 100 mmol/L (ref 98–111)
Creatinine, Ser: 0.79 mg/dL (ref 0.44–1.00)
GFR, Estimated: >60 mL/min (ref >60)
Glucose, Bld: 111 mg/dL — ABNORMAL HIGH (ref 70–99)
Potassium: 3.8 mmol/L (ref 3.5–5.1)
Sodium: 137 mmol/L (ref 135–145)
Total Bilirubin: 0.5 mg/dL (ref 0.0–1.2)
Total Protein: 8.8 g/dL — ABNORMAL HIGH (ref 6.5–8.1)

## 2024-09-25 LAB — URINALYSIS, MICROSCOPIC (REFLEX)

## 2024-09-25 LAB — URINALYSIS, ROUTINE W REFLEX MICROSCOPIC
Glucose, UA: NEGATIVE mg/dL
Hgb urine dipstick: NEGATIVE
Ketones, ur: 80 mg/dL — AB
Leukocytes,Ua: NEGATIVE
Nitrite: POSITIVE — AB
Protein, ur: 100 mg/dL — AB
Specific Gravity, Urine: 1.02 (ref 1.005–1.030)
pH: 6 (ref 5.0–8.0)

## 2024-09-25 LAB — CBC
HCT: 38 % (ref 36.0–46.0)
Hemoglobin: 12.4 g/dL (ref 12.0–15.0)
MCH: 28.6 pg (ref 26.0–34.0)
MCHC: 32.6 g/dL (ref 30.0–36.0)
MCV: 87.6 fL (ref 80.0–100.0)
Platelets: 383 K/uL (ref 150–400)
RBC: 4.34 MIL/uL (ref 3.87–5.11)
RDW: 12.4 % (ref 11.5–15.5)
WBC: 9.9 K/uL (ref 4.0–10.5)
nRBC: 0 % (ref 0.0–0.2)

## 2024-09-25 LAB — PREGNANCY, URINE: Preg Test, Ur: NEGATIVE

## 2024-09-25 LAB — LIPASE, BLOOD: Lipase: 24 U/L (ref 11–51)

## 2024-09-25 MED ORDER — IOHEXOL 300 MG/ML  SOLN
80.0000 mL | Freq: Once | INTRAMUSCULAR | Status: AC | PRN
  Administered 2024-09-25: 80 mL via INTRAVENOUS

## 2024-09-25 MED ORDER — SULFAMETHOXAZOLE-TRIMETHOPRIM 800-160 MG PO TABS: 1.0000 | ORAL_TABLET | Freq: Two times a day (BID) | ORAL | 0 refills | Status: AC

## 2024-09-25 NOTE — ED Triage Notes (Signed)
 Pt c/o lower abd pain, back pain, urinary frequency.    Recently seen at [facility] for same, prescribed and taking antibiotics as directed x2 days, no improvement.   Denies discharge, fever, concern for STI. Denies n/v/d.   Took ibuprofen  appx 1200 today, some relief.

## 2024-09-25 NOTE — ED Provider Notes (Signed)
 [place] [facility] Provider Note   CSN: [id number] Arrival date & time: 09/25/24  1400     Patient presents with: Abdominal Pain   Sandra Shields is a 28 y.o. female.  Patient with past history significant for kidney stone migraines presents to the emergency department with concerns of left-sided flank pain and suprapubic tenderness.  Reports this been ongoing for about 1 week prior on antibiotics.  She states that she was told today that her urine culture came back negative and was advised to discontinue her antibiotics.  She denies any treatment symptoms.  No reported vaginal discharge or drainage.  No reported concerns for STIs as she denies actual activity over the last year.  No concerns for pregnancy.  Has taken Profen with some relief in symptoms.  No vomiting, diarrhea.    Abdominal Pain      Prior to Admission medications   Medication Sig Start Date End Date Taking? Authorizing Provider  sulfamethoxazole-trimethoprim (BACTRIM DS) 800-160 MG tablet Take 1 tablet by mouth 2 (two) times daily for 3 days. 09/25/24 09/28/24 Yes Neasia Fleeman A, PA-C  busPIRone (BUSPAR) 5 MG tablet Take 5 mg by mouth. 08/23/24 10/22/24  [provider]  cephALEXin  (KEFLEX ) 500 MG capsule Take 2 capsules (1,000 mg total) by mouth 2 (two) times daily. Patient not taking: Reported on 08/21/2021 07/14/21   Teresa Shelba SAUNDERS, NP  fluconazole  (DIFLUCAN ) 150 MG tablet Take 1 tablet (150 mg total) by mouth every three (3) days as needed. May repeat in 3 days if symptoms not resolved 09/23/24   Mecum, Erin E, PA-C  loratadine (CLARITIN) 10 MG tablet Take 10 mg by mouth daily as needed for allergies.    [provider]  medroxyPROGESTERone Acetate 150 MG/ML SUSY Inject 150 mg into the muscle. 08/23/24   [provider]  nitrofurantoin, macrocrystal-monohydrate, (MACROBID) 100 MG capsule Take 1 capsule (100 mg total) by mouth 2 (two) times daily.  09/23/24   Mecum, Erin E, PA-C  norethindrone-ethinyl estradiol-FE (LOESTRIN FE) 1-20 MG-MCG tablet Take 1 tablet by mouth daily. 07/29/21   [provider]  oxyCODONE -acetaminophen  (PERCOCET/ROXICET) 5-325 MG tablet Take 1 tablet by mouth every 6 (six) hours as needed for severe pain. 08/21/21   Dasie Faden, MD  sertraline (ZOLOFT) 50 MG tablet Take 50 mg by mouth daily. 08/23/24 10/22/24  [provider]  tamsulosin  (FLOMAX ) 0.4 MG CAPS capsule Take 1 capsule (0.4 mg total) by mouth daily. 11/26/22   Hazen Darryle BRAVO, FNP    Allergies: Patient has no known allergies.    Review of Systems  Gastrointestinal:  Positive for abdominal pain.  All other systems reviewed and are negative.   Updated Vital Signs BP 104/75   Pulse 85   Temp 98.2 F (36.8 C) (Oral)   Resp 15   Ht 5' 6 (1.676 m)   Wt 90.7 kg   LMP 09/18/2024 (Approximate)   SpO2 100%   BMI 32.28 kg/m   Physical Exam Vitals and nursing note reviewed.  Constitutional:      General: She is not in acute distress.    Appearance: She is well-developed.  HENT:     Head: Normocephalic and atraumatic.  Eyes:     Conjunctiva/sclera: Conjunctivae normal.  Cardiovascular:     Rate and Rhythm: Normal rate and regular rhythm.     Heart sounds: No murmur heard. Pulmonary:     Effort: Pulmonary effort is normal. No respiratory distress.  Breath sounds: Normal breath sounds.  Abdominal:     Palpations: Abdomen is soft.     Tenderness: There is abdominal tenderness in the suprapubic area and left lower quadrant. There is left CVA tenderness. There is no right CVA tenderness.  Musculoskeletal:        General: No swelling.     Cervical back: Neck supple.  Skin:    General: Skin is warm and dry.     Capillary Refill: Capillary refill takes less than 2 seconds.  Neurological:     Mental Status: She is alert.  Psychiatric:        Mood and Affect: Mood normal.     (all labs ordered are listed, but only abnormal  results are displayed) Labs Reviewed  COMPREHENSIVE METABOLIC PANEL WITH GFR - Abnormal; Notable for the following components:      Result Value   Glucose, Bld 111 (*)    Total Protein 8.8 (*)    All other components within normal limits  URINALYSIS, ROUTINE W REFLEX MICROSCOPIC - Abnormal; Notable for the following components:   Color, Urine ORANGE (*)    APPearance HAZY (*)    Bilirubin Urine SMALL (*)    Ketones, ur 80 (*)    Protein, ur 100 (*)    Nitrite POSITIVE (*)    All other components within normal limits  URINALYSIS, MICROSCOPIC (REFLEX) - Abnormal; Notable for the following components:   Bacteria, UA RARE (*)    All other components within normal limits  URINE CULTURE  LIPASE, BLOOD  CBC  PREGNANCY, URINE    EKG: None  Radiology: CT ABDOMEN PELVIS W CONTRAST Result Date: 09/25/2024 CLINICAL DATA:  Abdominal/flank pain, stone suspected EXAM: CT ABDOMEN AND PELVIS WITH CONTRAST TECHNIQUE: Multidetector CT imaging of the abdomen and pelvis was performed using the standard protocol following bolus administration of intravenous contrast. RADIATION DOSE REDUCTION: This exam was performed according to the departmental dose-optimization program which includes automated exposure control, adjustment of the mA and/or kV according to patient size and/or use of iterative reconstruction technique. CONTRAST:  80mL OMNIPAQUE  IOHEXOL  300 MG/ML  SOLN COMPARISON:  None available. FINDINGS: Lower chest: No focal airspace consolidation or pleural effusion. Hepatobiliary: No mass.No radiopaque stones or wall thickening of the gallbladder. No intrahepatic or extrahepatic biliary ductal dilation. The portal veins are patent. Pancreas: No mass or main ductal dilation. No peripancreatic inflammation or fluid collection. Spleen: Normal size. No mass. Adrenals/Urinary Tract: No adrenal masses. No renal mass. No nephrolithiasis or hydronephrosis. The urinary bladder is completely decompressed.  Stomach/Bowel: The stomach is decompressed without focal abnormality. No small bowel wall thickening or inflammation. No small bowel obstruction.Normal appendix. Vascular/Lymphatic: No aortic aneurysm. Duplicated left-sided IVC. No intraabdominal or pelvic lymphadenopathy. Reproductive: Enlarged uterus containing a couple of hypodense masses, measuring up to 8.2 cm. No concerning adnexal mass.No free pelvic fluid. Other: No pneumoperitoneum, ascites, or mesenteric inflammation. Musculoskeletal: No acute fracture or destructive lesion. IMPRESSION: 1. No acute intra-abdominal or pelvic abnormality. 2. Enlarged uterus containing a couple of hypodense masses anteriorly, measuring up to 8.2 cm, likely uterine fibroids. Given the heterogeneous, somewhat irregular enhancement of the posterior/leftward fibroid, a nonemergent pelvic MRI with IV contrast recommended for further characterization. Electronically Signed   By: Rogelia Myers M.D.   On: 09/25/2024 17:31     Procedures   Medications Ordered in the ED  iohexol  (OMNIPAQUE ) 300 MG/ML solution 80 mL (80 mLs Intravenous Contrast Given 09/25/24 1604)  Medical Decision Making Amount and/or Complexity of Data Reviewed Labs: ordered. Radiology: ordered.  Risk Prescription drug management.   This patient presents to the ED for concern of abdominal pain, this involves an extensive number of treatment options, and is a complaint that carries with it a high risk of complications and morbidity.  The differential diagnosis includes UTI, pyelonephritis, urolithiasis, bowel obstruction, tubo-ovarian abscess, ovarian cyst   Co morbidities that complicate the patient evaluation  Kidney stone   Additional history obtained:  Additional history obtained from epic chart review   Lab Tests:  I Ordered, and personally interpreted labs.  The pertinent results include: CBC unremarkable, CMP unremarkable with no evidence  of renal dysfunction, UA likely infection with rare bacteria and nitrite positive finding.  There is also notably some proteinuria still present.  Unclear if this is due to renal component given normal renal function versus infectious etiology.  Urine culture pending. Lipase unremarkable, urine pregnancy negative   Imaging Studies ordered:  I ordered imaging studies including CT abdomen pelvis I independently visualized and interpreted imaging which showed negative for acute findings, uterine fibroid seen which should be further evaluated with nonemergent MRI with contrast I agree with the radiologist interpretation   Consultations Obtained:  I requested consultation with none,  and discussed lab and imaging findings as well as pertinent plan - they recommend: N/A   Problem List / ED Course / Critical interventions / Medication management  Patient with past history significant for kidney stones presents emergency department concerns of urinary discomfort for the last week.  States that she was seen at urgent care 2 days ago, diagnosed which resulted negative today and was advised to discontinue for 2 days and denies improvement.  No reported fever, chills, body aches.  Endorses some left-sided CVA tenderness as well as left lower quadrant and suprapubic discomfort.  States that she had a kidney stone about 3 years ago and states that these current symptoms feel somewhat distinct and more mild than that time.  She is not routinely followed by urology Physical exam reveals tenderness primarily towards the left flank, left lower quadrant, and suprapubic region.  No other focal abnormalities with no wheezes, rales, rhonchi, no appreciable heart murmur Based on exam, initiate workup for evaluation of possible kidney stone versus diverticulitis versus other.  Lab work initiated.  Will reassess shortly as patient is stable appearing this time. Patient's workup is largely reassuring.  No leukocytosis  seen.  UA consistent with infection with rare bacteria and nitrite positive finding although no leukocytes are seen.  Patient does notably continue to have proteinuria present.  CMP shows no renal dysfunction.  Patient has no history of diabetes or renal disease as far as she is aware. CT imaging is negative for acute findings such as pyelonephritis, urolithiasis, or findings to suggest cystitis although there are sizable uterine fibroids present.  MRI is recommended not emergently. Based on workup, do feel the patient benefit from outpatient treatment with change of antibiotics from Macrobid to Bactrim.  Urine culture sent off for further testing.  Advised patient that urine culture results will be available in the next Avril days and treatment may be adjusted as needed.  Patient verbalized understanding and agreement.  Otherwise stable for outpatient follow-up and discharged home. I have reviewed the patients home medicines and have made adjustments as needed   Social Determinants of Health:  None   Test / Admission - Considered:  Considered but stable for outpatient follow-up.  Final  diagnoses:  Acute cystitis without hematuria  Uterine leiomyoma, unspecified location    ED Discharge Orders          Ordered    sulfamethoxazole-trimethoprim (BACTRIM DS) 800-160 MG tablet  2 times daily        09/25/24 1837               Harlo Fabela A, PA-C 09/25/24 1905    Floyd, Dan, DO 09/25/24 1921

## 2024-09-25 NOTE — Discharge Instructions (Addendum)
 You were seen in the emergency department for concerns of abdominal pain and urinary discomfort. Based on your labs and imaging, I do believe you likely have a UTI. Your CT scan did not show signs of kidney stone or kidney infection. Your urine shows nitrites which is typically seen with UTIs, particularly E.coli related UTIs. Since you have not had improvement with the nitrofurantoin you have taken, I have switched your antibiotics to Bactrim DS. Take this twice daily for the next 3 days. Follow up with your primary care provider for further evaluation.  Your CT scan mentioned notable uterine fibroids seen and appear to be enlarged compared to earlier this year. An MRI with IV contrast would be helpful to look at this these further and this can be ordered by your primary care provider or your OBGYN. For any concerns of heavy

## 2024-09-27 LAB — URINE CULTURE: Culture: NO GROWTH
